# Patient Record
Sex: Male | Born: 1960 | Race: Black or African American | Hispanic: No | Marital: Married | Smoking: Never smoker
Health system: Southern US, Community
[De-identification: ages and names within clinical notes are randomized; demographics above are authoritative.]

## PROBLEM LIST (undated history)

## (undated) DIAGNOSIS — E785 Hyperlipidemia, unspecified: Secondary | ICD-10-CM

## (undated) DIAGNOSIS — I10 Essential (primary) hypertension: Secondary | ICD-10-CM

## (undated) HISTORY — PX: CATARACT EXTRACTION: SUR2

---

## 2000-09-24 ENCOUNTER — Ambulatory Visit (HOSPITAL_COMMUNITY): Admission: RE | Admit: 2000-09-24 | Discharge: 2000-09-24 | Payer: Self-pay | Admitting: Infectious Diseases

## 2000-09-24 ENCOUNTER — Encounter: Payer: Self-pay | Admitting: Infectious Diseases

## 2001-11-21 ENCOUNTER — Encounter: Payer: Self-pay | Admitting: General Practice

## 2001-11-21 ENCOUNTER — Encounter: Admission: RE | Admit: 2001-11-21 | Discharge: 2001-11-21 | Payer: Self-pay | Admitting: General Practice

## 2002-08-30 ENCOUNTER — Encounter: Payer: Self-pay | Admitting: Occupational Medicine

## 2002-08-30 ENCOUNTER — Encounter: Admission: RE | Admit: 2002-08-30 | Discharge: 2002-08-30 | Payer: Self-pay | Admitting: Occupational Medicine

## 2004-10-10 ENCOUNTER — Emergency Department (HOSPITAL_COMMUNITY): Admission: EM | Admit: 2004-10-10 | Discharge: 2004-10-10 | Payer: Self-pay | Admitting: Family Medicine

## 2005-03-10 ENCOUNTER — Emergency Department (HOSPITAL_COMMUNITY): Admission: EM | Admit: 2005-03-10 | Discharge: 2005-03-10 | Payer: Self-pay | Admitting: Family Medicine

## 2005-03-19 ENCOUNTER — Ambulatory Visit: Payer: Self-pay | Admitting: Gastroenterology

## 2005-03-27 ENCOUNTER — Encounter (INDEPENDENT_AMBULATORY_CARE_PROVIDER_SITE_OTHER): Payer: Self-pay | Admitting: Specialist

## 2005-03-27 ENCOUNTER — Ambulatory Visit: Payer: Self-pay | Admitting: Gastroenterology

## 2006-02-06 ENCOUNTER — Emergency Department (HOSPITAL_COMMUNITY): Admission: EM | Admit: 2006-02-06 | Discharge: 2006-02-06 | Payer: Self-pay | Admitting: Emergency Medicine

## 2006-06-09 ENCOUNTER — Emergency Department (HOSPITAL_COMMUNITY): Admission: EM | Admit: 2006-06-09 | Discharge: 2006-06-09 | Payer: Self-pay | Admitting: Family Medicine

## 2006-11-12 ENCOUNTER — Encounter: Admission: RE | Admit: 2006-11-12 | Discharge: 2006-11-23 | Payer: Self-pay | Admitting: Internal Medicine

## 2007-01-22 IMAGING — CR DG ABDOMEN 1V
2 series · 2 of 2 positions shown · non-contrast
Comparison: none

CLINICAL DATA: Fever. Chronic constipation. Gas.
 ABDOMEN ? 1 VIEW:

[view not recorded (1 of 2)]
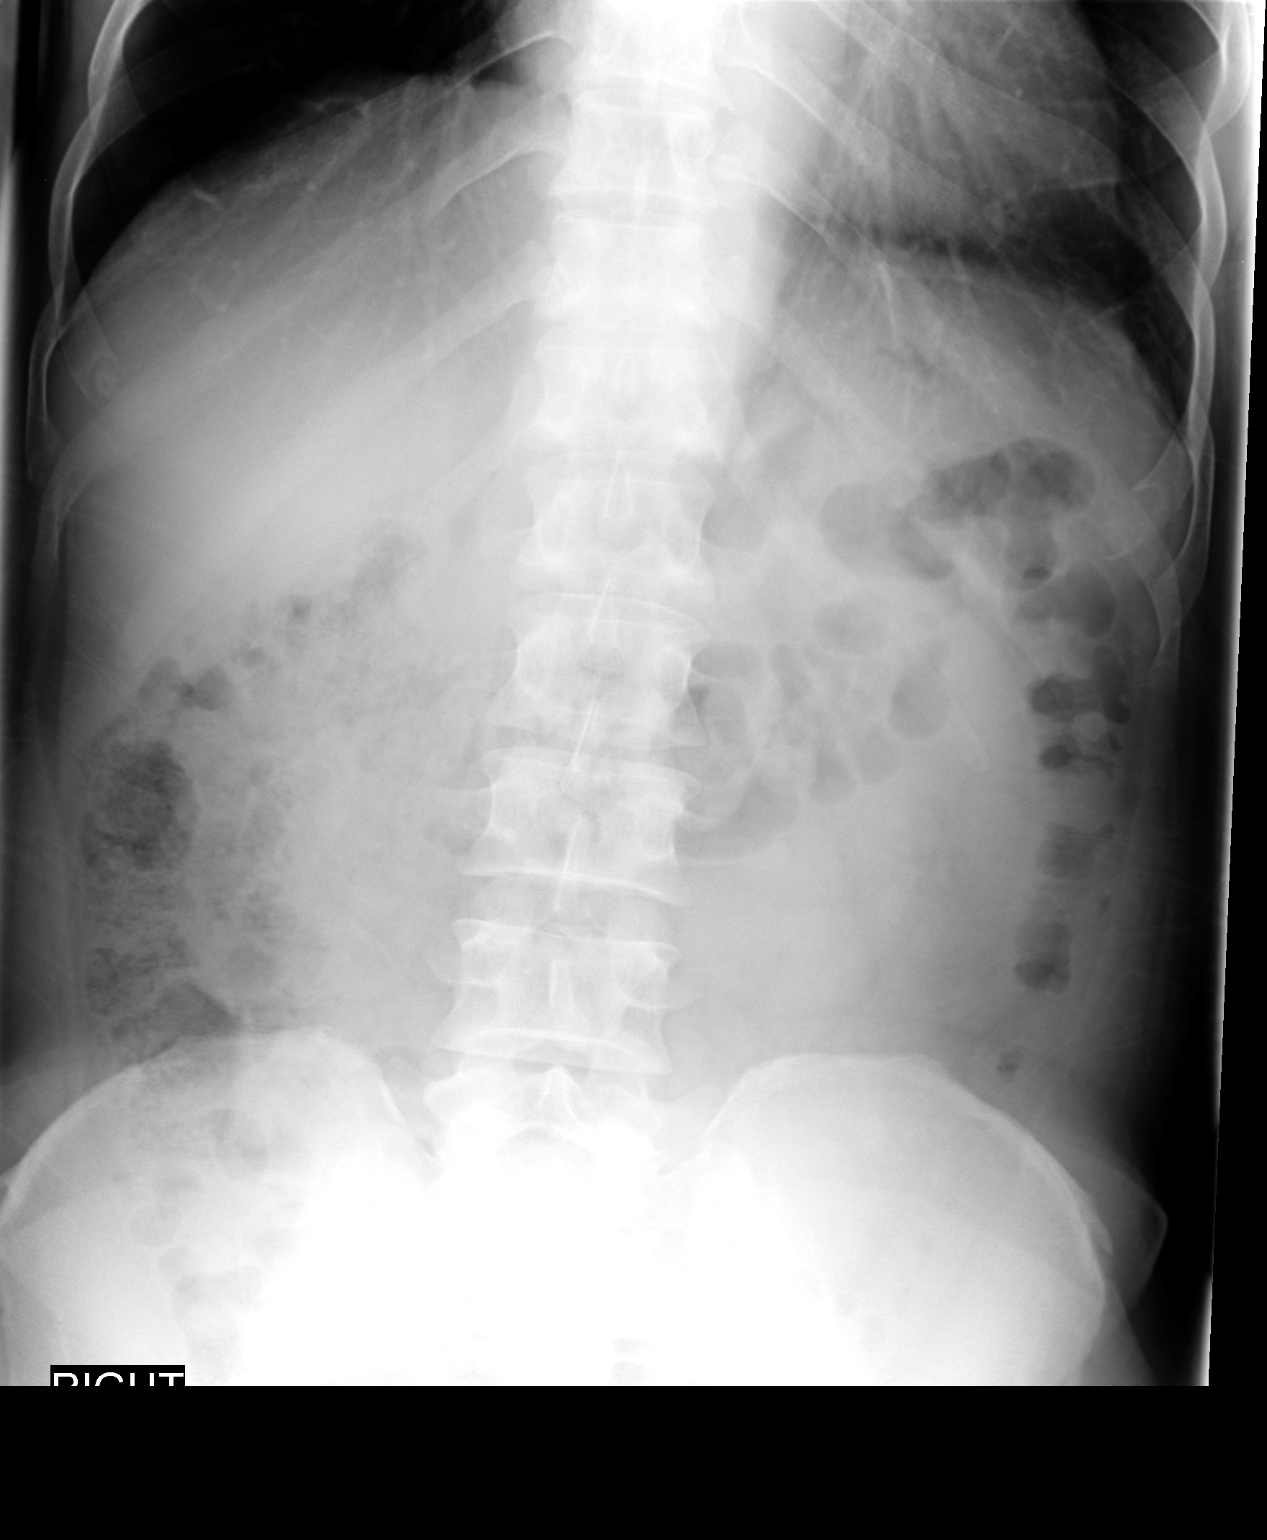

[view not recorded (2 of 2)]
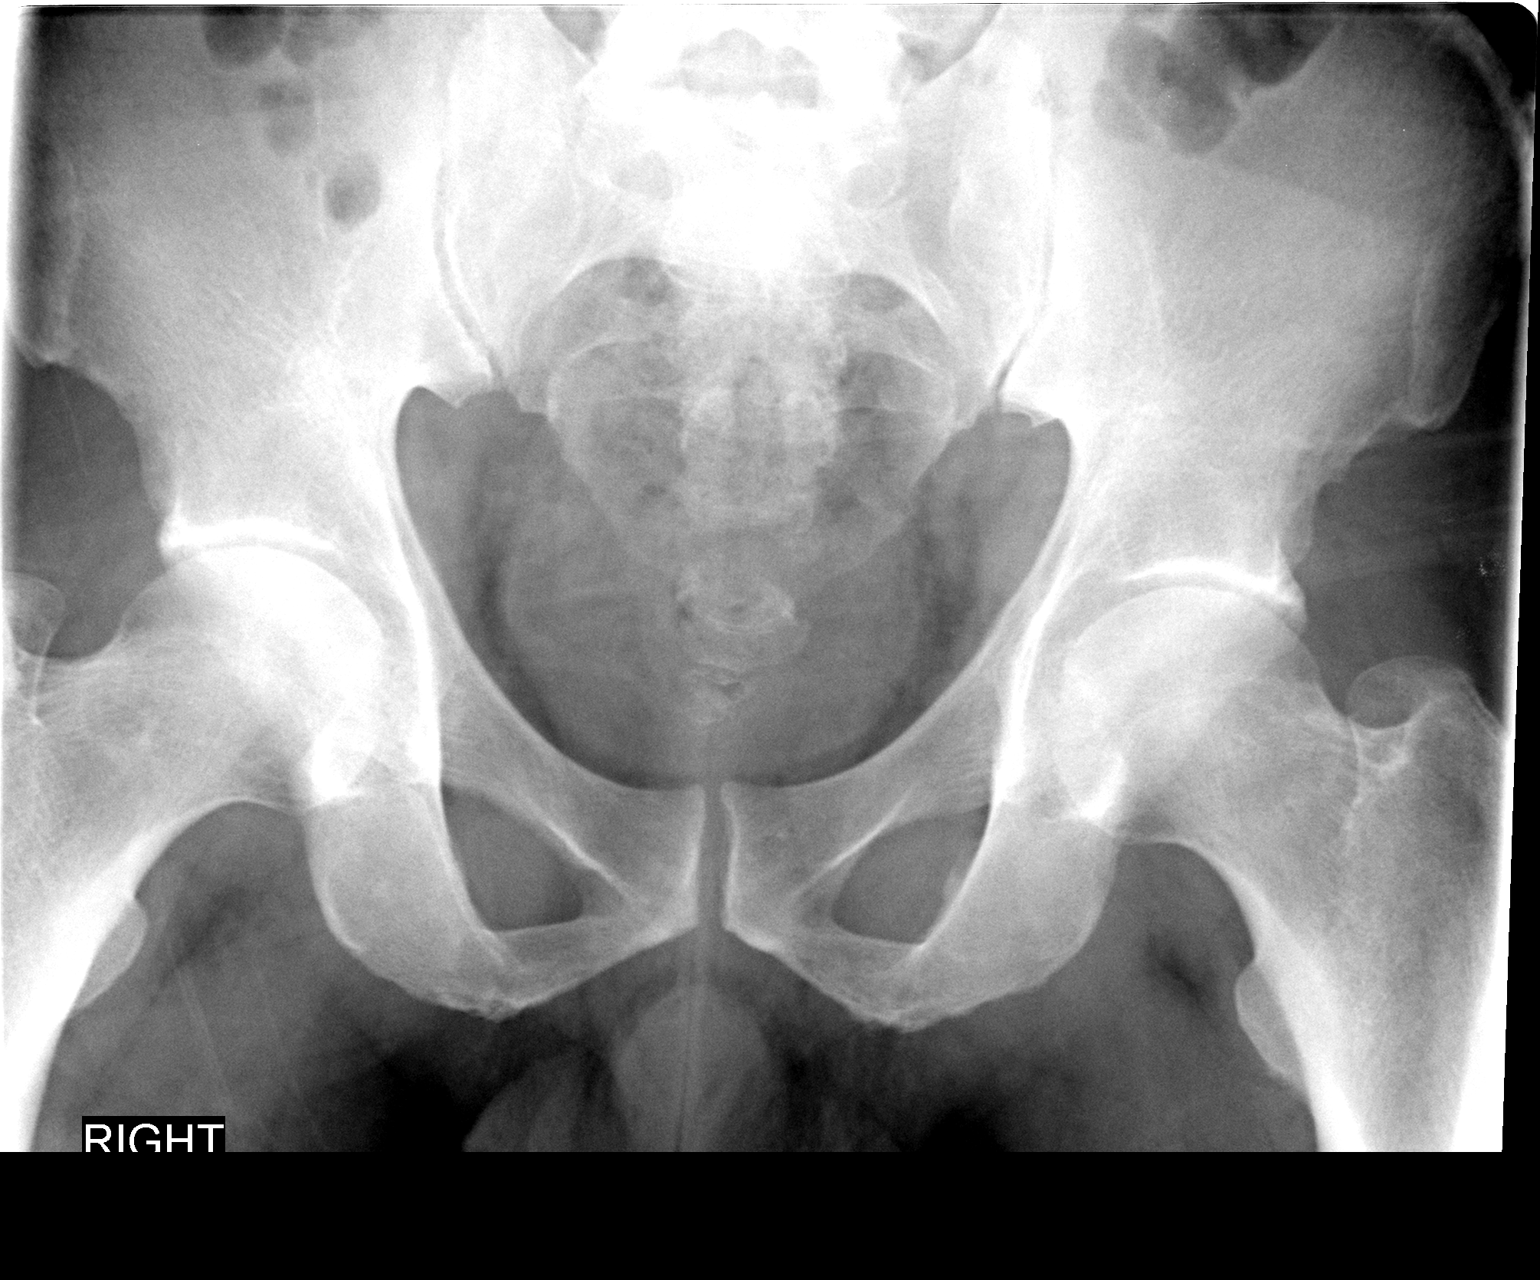

[2 of 2 positions shown; findings below may reference images not displayed]

FINDINGS: AP views of the abdomen are made without previous films for comparison and show a moderate amount of fecal material within the right transverse colon.  There may be a small fecal impaction in the region of the rectum, but no obstruction seen. No abdominal mass or calcification is present. The bones of the lower thoracic lumbar spine and pelvis appear normal.
IMPRESSION: Moderate amount of fecal material right and transverse colon and rectum without large fecal impaction or obstruction being seen and there may be a small fecal impaction or obstruction being seen and there may be a small fecal impaction in the rectum.

## 2011-10-27 ENCOUNTER — Encounter: Payer: Self-pay | Admitting: Gastroenterology

## 2015-06-06 ENCOUNTER — Encounter: Payer: Self-pay | Admitting: Gastroenterology

## 2020-07-30 ENCOUNTER — Encounter (HOSPITAL_COMMUNITY): Payer: Self-pay | Admitting: Emergency Medicine

## 2020-07-30 ENCOUNTER — Other Ambulatory Visit: Payer: Self-pay

## 2020-07-30 ENCOUNTER — Ambulatory Visit (HOSPITAL_COMMUNITY)
Admission: EM | Admit: 2020-07-30 | Discharge: 2020-07-30 | Disposition: A | Discharge status: Home / Self Care | Payer: Self-pay

## 2020-07-30 DIAGNOSIS — Z760 Encounter for issue of repeat prescription: Secondary | ICD-10-CM

## 2020-07-30 DIAGNOSIS — I10 Essential (primary) hypertension: Secondary | ICD-10-CM

## 2020-07-30 HISTORY — DX: Essential (primary) hypertension: I10

## 2020-07-30 HISTORY — DX: Hyperlipidemia, unspecified: E78.5

## 2020-07-30 MED ORDER — AMLODIPINE BESYLATE 10 MG PO TABS: 10.0000 mg | ORAL_TABLET | Freq: Every day | ORAL | 0 refills | Status: AC

## 2020-07-30 MED ORDER — HYDROCHLOROTHIAZIDE 12.5 MG PO TABS: 12.5000 mg | ORAL_TABLET | Freq: Every day | ORAL | 0 refills | Status: AC

## 2020-07-30 MED ORDER — ENALAPRIL MALEATE 10 MG PO TABS: 10.0000 mg | ORAL_TABLET | Freq: Every day | ORAL | 0 refills | Status: AC

## 2020-07-30 NOTE — ED Provider Notes (Signed)
[facility]    CSN: [id number] Arrival date & time: 07/30/20  1039      History   Chief Complaint Chief Complaint  Patient presents with   Medication Refill    HPI Larry Scott is a 60 y.o. male.   Patient here requesting refill of HTN medication, he has been out of medication X 1 week.  Requesting amlodpine 10 mg QD, enalapril 10 mg QD, and HCTZ 12.5 mg QD.  He has been getting medications from [place].  Denies HA, vision changes, blurry vision, double vision, CP, palptations, LE edema, n/v, n/t, weakness.     Past Medical History:  Diagnosis Date   Hyperlipidemia    Hypertension     There are no problems to display for this patient.   History reviewed. No pertinent surgical history.     Home Medications    Prior to Admission medications   Medication Sig Start Date End Date Taking? Authorizing Provider  amLODipine (NORVASC) 10 MG tablet Take 1 tablet (10 mg total) by mouth daily. 07/30/20   Evern Core, PA-C  enalapril (VASOTEC) 10 MG tablet Take 1 tablet (10 mg total) by mouth daily. 07/30/20   Evern Core, PA-C  hydrochlorothiazide (HYDRODIURIL) 12.5 MG tablet Take 1 tablet (12.5 mg total) by mouth daily. 07/30/20   Evern Core, PA-C    Family History History reviewed. No pertinent family history.  Social History Social History   Tobacco Use   Smoking status: Never   Smokeless tobacco: Never  Substance Use Topics   Alcohol use: Yes   Drug use: Never     Allergies   Patient has no known allergies.   Review of Systems Review of Systems  Constitutional:  Negative for activity change and fatigue.  Eyes:  Negative for visual disturbance.  Respiratory:  Negative for cough, chest tightness, shortness of breath and wheezing.   Cardiovascular:  Negative for chest pain, palpitations and leg swelling.  Gastrointestinal:  Negative for nausea and vomiting.  Musculoskeletal:  Negative for arthralgias and myalgias.  Skin:  Negative for  color change.  Neurological:  Negative for dizziness, syncope, speech difficulty, weakness, light-headedness, numbness and headaches.  Psychiatric/Behavioral:  Negative for confusion and sleep disturbance.     Physical Exam Triage Vital Signs ED Triage Vitals  Enc Vitals Group     BP 07/30/20 1118 (!) 147/84     Pulse Rate 07/30/20 1118 87     Resp 07/30/20 1118 16     Temp 07/30/20 1118 98.5 F (36.9 C)     Temp Source 07/30/20 1118 Oral     SpO2 07/30/20 1118 97 %     Weight --      Height --      Head Circumference --      Peak Flow --      Pain Score 07/30/20 1115 0     Pain Loc --      Pain Edu? --      Excl. in GC? --    No data found.  Updated Vital Signs BP (!) 147/84 (BP Location: Right Arm)   Pulse 87   Temp 98.5 F (36.9 C) (Oral)   Resp 16   SpO2 97%   Visual Acuity Right Eye Distance:   Left Eye Distance:   Bilateral Distance:    Right Eye Near:   Left Eye Near:    Bilateral Near:     Physical Exam Vitals and nursing note reviewed.  Constitutional:  Appearance: He is well-developed.  HENT:     Head: Normocephalic and atraumatic.  Eyes:     Extraocular Movements: Extraocular movements intact.     Right eye: Normal extraocular motion.     Left eye: Normal extraocular motion.     Conjunctiva/sclera: Conjunctivae normal.     Pupils: Pupils are equal, round, and reactive to light.  Cardiovascular:     Rate and Rhythm: Normal rate and regular rhythm.     Heart sounds: Normal heart sounds. No murmur heard. Pulmonary:     Effort: Pulmonary effort is normal. No respiratory distress.     Breath sounds: Normal breath sounds. No wheezing, rhonchi or rales.  Musculoskeletal:     Cervical back: Neck supple.     Right lower leg: No swelling or tenderness. No edema.     Left lower leg: No swelling or tenderness. No edema.  Skin:    General: Skin is warm and dry.     Capillary Refill: Capillary refill takes less than 2 seconds.  Neurological:      General: No focal deficit present.     Mental Status: He is alert and oriented to person, place, and time.     GCS: GCS eye subscore is 4. GCS verbal subscore is 5. GCS motor subscore is 6.     Cranial Nerves: Cranial nerves are intact. No cranial nerve deficit.     Motor: No tremor, abnormal muscle tone or pronator drift.     Deep Tendon Reflexes:     Reflex Scores:      Patellar reflexes are 0 on the right side and 0 on the left side.    UC Treatments / Results  Labs (all labs ordered are listed, but only abnormal results are displayed) Labs Reviewed - No data to display  EKG   Radiology No results found.  Procedures Procedures (including critical care time)  Medications Ordered in UC Medications - No data to display  Initial Impression / Assessment and Plan / UC Course  I have reviewed the triage vital signs and the nursing notes.  Pertinent labs & imaging results that were available during my care of the patient were reviewed by me and considered in my medical decision making (see chart for details).     Med refill Follow up with PCP for continued management ED precautions provided Final Clinical Impressions(s) / UC Diagnoses   Final diagnoses:  Medication refill  Primary hypertension     Discharge Instructions      Follow up with PCP     ED Prescriptions     Medication Sig Dispense Auth. Provider   amLODipine (NORVASC) 10 MG tablet Take 1 tablet (10 mg total) by mouth daily. 30 tablet Evern Core, PA-C   enalapril (VASOTEC) 10 MG tablet Take 1 tablet (10 mg total) by mouth daily. 30 tablet Evern Core, PA-C   hydrochlorothiazide (HYDRODIURIL) 12.5 MG tablet Take 1 tablet (12.5 mg total) by mouth daily. 30 tablet Evern Core, PA-C      PDMP not reviewed this encounter.   Evern Core, PA-C 07/30/20 1141

## 2020-07-30 NOTE — Discharge Instructions (Addendum)
Follow up with PCP

## 2020-07-30 NOTE — ED Triage Notes (Signed)
Pt presents for RX refill. States has been off medication for 5 days.

## 2021-07-22 ENCOUNTER — Other Ambulatory Visit: Payer: Self-pay

## 2021-07-22 ENCOUNTER — Encounter (HOSPITAL_COMMUNITY): Payer: Self-pay | Admitting: *Deleted

## 2021-07-22 ENCOUNTER — Ambulatory Visit (HOSPITAL_COMMUNITY)
Admission: EM | Admit: 2021-07-22 | Discharge: 2021-07-22 | Disposition: A | Discharge status: Home / Self Care | Payer: Self-pay | Attending: Student | Admitting: Student

## 2021-07-22 DIAGNOSIS — S46812A Strain of other muscles, fascia and tendons at shoulder and upper arm level, left arm, initial encounter: Secondary | ICD-10-CM

## 2021-07-22 MED ORDER — TIZANIDINE HCL 2 MG PO TABS: 2.0000 mg | ORAL_TABLET | Freq: Three times a day (TID) | ORAL | 0 refills | Status: DC | PRN

## 2021-07-22 NOTE — ED Provider Notes (Signed)
[facility]    CSN: [id number] Arrival date & time: 07/22/21  1122      History   Chief Complaint Chief Complaint  Patient presents with   Back Pain    HPI Larry Scott is a 61 y.o. male presenting with 3 months of left neck and back pain.  History hypertension, hyperlipidemia.  Describes left shoulder pain, worse with movement.  Occasionally radiation of the pain down the left arm, but not currently.  Denies trauma or overuse.  Denies midline spinous pain.  Denies headaches, vision changes  HPI  Past Medical History:  Diagnosis Date   Hyperlipidemia    Hypertension     There are no problems to display for this patient.   History reviewed. No pertinent surgical history.     Home Medications    Prior to Admission medications   Medication Sig Start Date End Date Taking? Authorizing Provider  tiZANidine (ZANAFLEX) 2 MG tablet Take 1 tablet (2 mg total) by mouth every 8 (eight) hours as needed for muscle spasms. 07/22/21  Yes Rhys Martini, PA-C  amLODipine (NORVASC) 10 MG tablet Take 1 tablet (10 mg total) by mouth daily. 07/30/20   Evern Core, PA-C  enalapril (VASOTEC) 10 MG tablet Take 1 tablet (10 mg total) by mouth daily. 07/30/20   Evern Core, PA-C  hydrochlorothiazide (HYDRODIURIL) 12.5 MG tablet Take 1 tablet (12.5 mg total) by mouth daily. 07/30/20   Evern Core, PA-C    Family History History reviewed. No pertinent family history.  Social History Social History   Tobacco Use   Smoking status: Never   Smokeless tobacco: Never  Substance Use Topics   Alcohol use: Yes   Drug use: Never     Allergies   Patient has no known allergies.   Review of Systems Review of Systems  Musculoskeletal:  Positive for neck pain.  All other systems reviewed and are negative.    Physical Exam Triage Vital Signs ED Triage Vitals  Enc Vitals Group     BP 07/22/21 1230 (!) 138/91     Pulse Rate 07/22/21 1230 71     Resp 07/22/21 1230  16     Temp 07/22/21 1230 98.3 F (36.8 C)     Temp src --      SpO2 07/22/21 1230 97 %     Weight --      Height --      Head Circumference --      Peak Flow --      Pain Score 07/22/21 1227 9     Pain Loc --      Pain Edu? --      Excl. in GC? --    No data found.  Updated Vital Signs BP (!) 138/91   Pulse 71   Temp 98.3 F (36.8 C)   Resp 16   SpO2 97%   Visual Acuity Right Eye Distance:   Left Eye Distance:   Bilateral Distance:    Right Eye Near:   Left Eye Near:    Bilateral Near:     Physical Exam Vitals reviewed.  Constitutional:      General: He is not in acute distress.    Appearance: Normal appearance. He is not ill-appearing.     Comments: Resting comfortably   HENT:     Head: Normocephalic and atraumatic.  Cardiovascular:     Rate and Rhythm: Normal rate and regular rhythm.     Heart sounds: Normal heart sounds.  Pulmonary:     Effort: Pulmonary effort is normal.     Breath sounds: Normal breath sounds and air entry.  Abdominal:     Tenderness: There is no abdominal tenderness. There is no right CVA tenderness, left CVA tenderness, guarding or rebound.  Musculoskeletal:     Cervical back: Normal range of motion. No swelling, deformity, signs of trauma, rigidity, spasms, tenderness, bony tenderness or crepitus. No pain with movement.     Thoracic back: No swelling, deformity, signs of trauma, spasms, tenderness or bony tenderness. Normal range of motion. No scoliosis.     Lumbar back: No swelling, deformity, signs of trauma, spasms, tenderness or bony tenderness. Normal range of motion. Negative right straight leg raise test and negative left straight leg raise test. No scoliosis.     Comments: No reproducible spinous or paraspinous tenderness. L trapezius pain elicited with abduction L arm. No shoulder jointline tenderness. No midline spinous tenderness, deformity, stepoff. Strength 5/5 upper extremities. Gait intact.   Absolutely no other injury,  deformity, tenderness, ecchymosis, abrasion.  Neurological:     General: No focal deficit present.     Mental Status: He is alert.     Cranial Nerves: No cranial nerve deficit.     Comments: CN 2-12 grossly intact, normal fingers to thumb.   Psychiatric:        Mood and Affect: Mood normal.        Behavior: Behavior normal.        Thought Content: Thought content normal.        Judgment: Judgment normal.      UC Treatments / Results  Labs (all labs ordered are listed, but only abnormal results are displayed) Labs Reviewed - No data to display  EKG   Radiology No results found.  Procedures Procedures (including critical care time)  Medications Ordered in UC Medications - No data to display  Initial Impression / Assessment and Plan / UC Course  I have reviewed the triage vital signs and the nursing notes.  Pertinent labs & imaging results that were available during my care of the patient were reviewed by me and considered in my medical decision making (see chart for details).     This patient is a very pleasant 61 y.o. year old male presenting with L trapezius strain x3 months. No red flag symptoms. Zanaflex sent. ED return precautions discussed. Patient verbalizes understanding and agreement.   Final Clinical Impressions(s) / UC Diagnoses   Final diagnoses:  Trapezius strain, left, initial encounter     Discharge Instructions      -Start the muscle relaxer-Zanaflex (tizanidine), up to 3 times daily for muscle spasms and pain.  This can make you drowsy, so take at bedtime or when you do not need to drive or operate machinery. -You can take Tylenol up to 1000 mg 3 times daily, and ibuprofen up to 600 mg 3 times daily with food.  You can take these together, or alternate every 3-4 hours. -You can continue over-the-counter products like icy hot, ointment, etc  -Heating pad  -Follow-up if new symptoms like weakness or sensation changes    ED Prescriptions      Medication Sig Dispense Auth. Provider   tiZANidine (ZANAFLEX) 2 MG tablet Take 1 tablet (2 mg total) by mouth every 8 (eight) hours as needed for muscle spasms. 21 tablet Rhys Martini, PA-C      PDMP not reviewed this encounter.   Rhys Martini, PA-C 07/22/21 1303

## 2021-07-22 NOTE — ED Triage Notes (Signed)
Pt reports 3 months of of pain at base of neck that goes down to lower back and lt shoulder.

## 2021-07-22 NOTE — Discharge Instructions (Addendum)
-  Start the muscle relaxer-Zanaflex (tizanidine), up to 3 times daily for muscle spasms and pain.  This can make you drowsy, so take at bedtime or when you do not need to drive or operate machinery. -You can take Tylenol up to 1000 mg 3 times daily, and ibuprofen up to 600 mg 3 times daily with food.  You can take these together, or alternate every 3-4 hours. -You can continue over-the-counter products like icy hot, ointment, etc  -Heating pad  -Follow-up if new symptoms like weakness or sensation changes

## 2022-01-15 ENCOUNTER — Ambulatory Visit (HOSPITAL_COMMUNITY)
Admission: EM | Admit: 2022-01-15 | Discharge: 2022-01-15 | Disposition: A | Discharge status: Home / Self Care | Payer: Commercial Managed Care - HMO | Attending: Internal Medicine | Admitting: Internal Medicine

## 2022-01-15 ENCOUNTER — Encounter (HOSPITAL_COMMUNITY): Payer: Self-pay

## 2022-01-15 ENCOUNTER — Ambulatory Visit (INDEPENDENT_AMBULATORY_CARE_PROVIDER_SITE_OTHER): Payer: Commercial Managed Care - HMO

## 2022-01-15 DIAGNOSIS — M545 Low back pain, unspecified: Secondary | ICD-10-CM

## 2022-01-15 DIAGNOSIS — M5136 Other intervertebral disc degeneration, lumbar region: Secondary | ICD-10-CM

## 2022-01-15 NOTE — Discharge Instructions (Addendum)
Please continue taking your anti-inflammatory agents as needed Gentle stretching exercises Back strengthening exercises recommended Please follow-up with orthopedic surgery if your pain is severe or if the frequency increases If you have any other concerns please return to urgent care to be reevaluated.

## 2022-01-15 NOTE — ED Triage Notes (Signed)
Pt states lower back pain on and off for months.

## 2022-01-16 NOTE — ED Provider Notes (Signed)
[facility]    CSN: [id number] Arrival date & time: 01/15/22  1004      History   Chief Complaint Chief Complaint  Patient presents with   Back Pain    HPI Larry Scott is a 61 y.o. male with history of hypertension and hyperlipidemia comes to urgent care with complaints of lower back pain over the past several months.  Pain in the back is intermittent in nature, sharp with throbbing aggravated by movement sometimes and relieved by NSAID use.  He denies any urinary or bowel problems.  Pain does not radiate into the lower extremities.  No weakness in lower extremities.  Patient denies any falls or trauma to the back.  No heavy lifting. HPI  Past Medical History:  Diagnosis Date   Hyperlipidemia    Hypertension     There are no problems to display for this patient.   History reviewed. No pertinent surgical history.     Home Medications    Prior to Admission medications   Medication Sig Start Date End Date Taking? Authorizing Provider  amLODipine (NORVASC) 10 MG tablet Take 1 tablet (10 mg total) by mouth daily. 07/30/20   Evern Core, PA-C  enalapril (VASOTEC) 10 MG tablet Take 1 tablet (10 mg total) by mouth daily. 07/30/20   Evern Core, PA-C  hydrochlorothiazide (HYDRODIURIL) 12.5 MG tablet Take 1 tablet (12.5 mg total) by mouth daily. 07/30/20   Evern Core, PA-C  tiZANidine (ZANAFLEX) 2 MG tablet Take 1 tablet (2 mg total) by mouth every 8 (eight) hours as needed for muscle spasms. 07/22/21   Rhys Martini, PA-C    Family History History reviewed. No pertinent family history.  Social History Social History   Tobacco Use   Smoking status: Never   Smokeless tobacco: Never  Substance Use Topics   Alcohol use: Yes   Drug use: Never     Allergies   Patient has no known allergies.   Review of Systems Review of Systems As per HPI  Physical Exam Triage Vital Signs ED Triage Vitals  Enc Vitals Group     BP 01/15/22 1308 138/68      Pulse Rate 01/15/22 1308 67     Resp 01/15/22 1308 16     Temp 01/15/22 1308 98 F (36.7 C)     Temp Source 01/15/22 1308 Oral     SpO2 01/15/22 1308 99 %     Weight 01/15/22 1310 190 lb (86.2 kg)     Height 01/15/22 1310 5\' 8"  (1.727 m)     Head Circumference --      Peak Flow --      Pain Score 01/15/22 1310 5     Pain Loc --      Pain Edu? --      Excl. in GC? --    No data found.  Updated Vital Signs BP 138/68 (BP Location: Right Arm)   Pulse 67   Temp 98 F (36.7 C) (Oral)   Resp 16   Ht 5\' 8"  (1.727 m)   Wt 86.2 kg   SpO2 99%   BMI 28.89 kg/m   Visual Acuity Right Eye Distance:   Left Eye Distance:   Bilateral Distance:    Right Eye Near:   Left Eye Near:    Bilateral Near:     Physical Exam Vitals and nursing note reviewed.  Constitutional:      Appearance: Normal appearance.  Cardiovascular:     Rate and Rhythm:  Normal rate and regular rhythm.     Pulses: Normal pulses.     Heart sounds: Normal heart sounds.  Pulmonary:     Effort: Pulmonary effort is normal.     Breath sounds: Normal breath sounds.  Musculoskeletal:        General: No swelling, tenderness, deformity or signs of injury. Normal range of motion.     Right lower leg: No edema.     Left lower leg: No edema.  Skin:    General: Skin is warm.     Findings: No bruising or erythema.  Neurological:     Mental Status: He is alert.      UC Treatments / Results  Labs (all labs ordered are listed, but only abnormal results are displayed) Labs Reviewed - No data to display  EKG   Radiology DG Lumbar Spine Complete  Result Date: 01/15/2022 CLINICAL DATA:  Recurrent back pain. EXAM: LUMBAR SPINE - COMPLETE 4+ VIEW COMPARISON:  None Available. FINDINGS: There is a very mild curvature of the lower thoracic and lumbar spine which is convex towards the left. Alignment on the lateral projection radiograph appears normal. The vertebral body heights are well preserved. Multi level ventral  endplate spurring noted throughout the lumbar spine. No acute fracture or subluxation. IMPRESSION: 1. No acute findings. 2. Mild scoliosis and degenerative disc disease. Electronically Signed   By: Signa Kell M.D.   On: 01/15/2022 14:35    Procedures Procedures (including critical care time)  Medications Ordered in UC Medications - No data to display  Initial Impression / Assessment and Plan / UC Course  I have reviewed the triage vital signs and the nursing notes.  Pertinent labs & imaging results that were available during my care of the patient were reviewed by me and considered in my medical decision making (see chart for details).     1.  Back pain secondary to degenerative disc disease: X-ray of the lumbosacral spine is remarkable for mild scoliosis and degenerative disc disease Patient is advised to continue NSAID use Gentle stretching exercises Back strengthening exercises Follow-up with orthopedic surgery for further management if pain is persistent or worsens. Final Clinical Impressions(s) / UC Diagnoses   Final diagnoses:  Degenerative disc disease, lumbar     Discharge Instructions      Please continue taking your anti-inflammatory agents as needed Gentle stretching exercises Back strengthening exercises recommended Please follow-up with orthopedic surgery if your pain is severe or if the frequency increases If you have any other concerns please return to urgent care to be reevaluated.   ED Prescriptions   None    PDMP not reviewed this encounter.   Merrilee Jansky, MD 01/16/22 [id number]

## 2022-01-20 ENCOUNTER — Encounter: Payer: Self-pay | Admitting: Family Medicine

## 2022-01-20 ENCOUNTER — Ambulatory Visit (INDEPENDENT_AMBULATORY_CARE_PROVIDER_SITE_OTHER): Payer: Commercial Managed Care - HMO | Admitting: Family Medicine

## 2022-01-20 VITALS — BP 143/86 | HR 76 | Ht 68.0 in | Wt 193.6 lb

## 2022-01-20 DIAGNOSIS — Z114 Encounter for screening for human immunodeficiency virus [HIV]: Secondary | ICD-10-CM

## 2022-01-20 DIAGNOSIS — K219 Gastro-esophageal reflux disease without esophagitis: Secondary | ICD-10-CM | POA: Insufficient documentation

## 2022-01-20 DIAGNOSIS — M545 Low back pain, unspecified: Secondary | ICD-10-CM | POA: Diagnosis not present

## 2022-01-20 DIAGNOSIS — Z13228 Encounter for screening for other metabolic disorders: Secondary | ICD-10-CM

## 2022-01-20 DIAGNOSIS — I10 Essential (primary) hypertension: Secondary | ICD-10-CM

## 2022-01-20 DIAGNOSIS — Z1211 Encounter for screening for malignant neoplasm of colon: Secondary | ICD-10-CM

## 2022-01-20 DIAGNOSIS — G8929 Other chronic pain: Secondary | ICD-10-CM

## 2022-01-20 DIAGNOSIS — Z1159 Encounter for screening for other viral diseases: Secondary | ICD-10-CM

## 2022-01-20 MED ORDER — FAMOTIDINE 20 MG PO TABS: 20.0000 mg | ORAL_TABLET | Freq: Two times a day (BID) | ORAL | 1 refills | Status: DC

## 2022-01-20 NOTE — Progress Notes (Signed)
Subjective:  Patient ID: Larry Scott, male    DOB: 12-Jun-1960, 61 y.o.   MRN: [id number]  CC: New Patient  HPI:  Larry Scott is a very pleasant 61 y.o. male who presents today to establish care. Previously followed with physician in [place].  Hx HLD, HTN, on amlodipine, HCTZ, vasotec. Doesn't usually check home BP.  Agrees he may be a little bit nervous today.  Endorses chronic occasional migraines and vertigo.  Recently visited urgent care for back pain, found to have mild degenerative disc disease. Well controlled w/ NSAIDs. Denies shooting pain down legs, saddle anesthesia, bowel/bladder incontinence  Got eye surgery yesterday with [facility]. Another procedure planned for 12/18. Using eye drops for this.  Feels that he gets constipated and gas pains easily. Has tried laxatives in the past without help. Endorses some burning/reflux pains after eating.  Open to getting colonoscopy and screening labs   PMHx: Past Medical History:  Diagnosis Date   Hyperlipidemia    Hypertension     Surgical Hx: Eye surgery, unspecified   Family Hx: Family History  Problem Relation Age of Onset   Hypertension Mother     Social Hx:  Current Social History   Who lives at home: Wife  Feels safe in his relationship Work / Education:  Unemployed Current stressors: Feels like his back/gas pain keeps him from "being free" Exercises fairly regularly by walking but has been limited a bit by pain Never smoked.  Occasionally drinks beer    Medications: amlodipine, HCTZ, vasotec   ROS:  Per HPI plus: Endorses occasional constipation. Denies recent vomiting. Denies recent CP/SOB, blood in stool or urine    ROS: pertinent noted in the HPI    Objective:  BP (!) 143/86   Pulse 76   Ht 5\' 8"  (1.727 m)   Wt 193 lb 9.6 oz (87.8 kg)   SpO2 100%   BMI 29.44 kg/m  Vitals and nursing note reviewed  General: NAD, pleasant, able to participate in exam Cardiac: RRR, no murmurs  auscultated Respiratory: CTAB, normal WOB Abdomen: soft, non-tender, non-distended, normoactive bowel sounds Extremities: warm and well perfused, no edema or cyanosis. Moves all extremities equally Skin: warm and dry, no rashes noted Neuro: alert, no obvious focal deficits, speech normal. Normal gait Psych: Normal affect and mood  Assessment & Plan:  Hypertension, unspecified type Assessment & Plan: 143/86 today.  Patient feels that he is overall well-controlled but may be a little nervous today so [facility] is a strong possibility. Taking amlodipine, HCTZ, Vasotec.  Will check BMP given that he is on enalapril, will also obtain A1c and lipid panel given that we have no previous labs on file.  Follow-up in 2 months  Orders: -     Basic metabolic panel  Gastroesophageal reflux disease, unspecified whether esophagitis present Assessment & Plan: Possible GERD given the description of his pain.  Constipation may also be contributing.  Does endorse some occasional burning sensation after meals.  Reports having normal colonoscopy and EGD in the past.  Willing to get screening colonoscopy.  Will refer for this, and prescribe Pepcid to see if this alleviates his pain.  Patient wishes to try this rather than a laxative.  Follow-up in 2 months  Orders: -     Famotidine; Take 1 tablet (20 mg total) by mouth 2 (two) times daily.  Dispense: 60 tablet; Refill: 1  Chronic low back pain without sciatica, unspecified back pain laterality Assessment & Plan: Low back pain  with x-ray finding of mild degenerative disc disease at recent urgent care visit.  Denies radicular pain or red flag symptoms.  Exam is benign today.  Will encourage continued conservative management with NSAID/Tylenol use and suggest use of lidocaine patches.  May consider referral for PT if no improvement   Screening for HIV (human immunodeficiency virus) -     HIV Antibody (routine testing w rflx)  Encounter for hepatitis C screening test  for low risk patient -     HCV Ab w Reflex to Quant PCR  Screening for metabolic disorder -     Hemoglobin A1c -     Lipid panel  Encounter for screening colonoscopy -     Ambulatory referral to Gastroenterology   Meds ordered this encounter  Medications   famotidine (PEPCID) 20 MG tablet    Sig: Take 1 tablet (20 mg total) by mouth 2 (two) times daily.    Dispense:  60 tablet    Refill:  1   Return in about 2 months (around 03/23/2022) for HTN, reflux pain. Vonna Drafts, MD 01/20/2022, 2:24 PM PGY-1, [facility]

## 2022-01-20 NOTE — Patient Instructions (Addendum)
It was wonderful to see you today.  Please bring ALL of your medications with you to every visit.   Updates from today's visit:  I will give you a call if any of your results from today are abnormal.   Please try taking Pepcid to see if it helps with your belly pains  Please review the attached documents for more information about fiber rich foods  Please try to use a lidocaine patch for your back pain  Please give our office a call if you start to have any severe pain, vomiting, fevers, difficulty breathing  Please follow up in 2 months to talk more about your belly pain and blood pressure  Thank you for choosing [facility].   Please call [phone] with any questions about today's appointment.  Please be sure to schedule follow up at the front  desk before you leave today.   Vonna Drafts, MD  Family Medicine

## 2022-01-20 NOTE — Assessment & Plan Note (Addendum)
143/86 today.  Patient feels that he is overall well-controlled but may be a little nervous today so [facility] is a strong possibility. Taking amlodipine, HCTZ, Vasotec.  Will check BMP given that he is on enalapril, will also obtain A1c and lipid panel given that we have no previous labs on file.  Follow-up in 2 months

## 2022-01-20 NOTE — Assessment & Plan Note (Addendum)
Low back pain with x-ray finding of mild degenerative disc disease at recent urgent care visit.  Denies radicular pain or red flag symptoms.  Exam is benign today.  Will encourage continued conservative management with NSAID/Tylenol use and suggest use of lidocaine patches.  May consider referral for PT if no improvement

## 2022-01-20 NOTE — Assessment & Plan Note (Addendum)
Possible GERD given the description of his pain.  Constipation may also be contributing.  Does endorse some occasional burning sensation after meals.  Reports having normal colonoscopy and EGD in the past.  Willing to get screening colonoscopy.  Will refer for this, and prescribe Pepcid to see if this alleviates his pain.  Patient wishes to try this rather than a laxative.  Follow-up in 2 months

## 2022-01-21 LAB — LIPID PANEL
Chol/HDL Ratio: 2.9 ratio (ref 0.0–5.0)
Cholesterol, Total: 230 mg/dL — ABNORMAL HIGH (ref 100–199)
HDL: 80 mg/dL (ref >39)
LDL Chol Calc (NIH): 120 mg/dL — ABNORMAL HIGH (ref 0–99)
Triglycerides: 175 mg/dL — ABNORMAL HIGH (ref 0–149)
VLDL Cholesterol Cal: 30 mg/dL (ref 5–40)

## 2022-01-21 LAB — BASIC METABOLIC PANEL
BUN/Creatinine Ratio: 9 — ABNORMAL LOW (ref 10–24)
BUN: 10 mg/dL (ref 8–27)
CO2: 27 mmol/L (ref 20–29)
Calcium: 9.8 mg/dL (ref 8.6–10.2)
Chloride: 101 mmol/L (ref 96–106)
Creatinine, Ser: 1.08 mg/dL (ref 0.76–1.27)
Glucose: 130 mg/dL — ABNORMAL HIGH (ref 70–99)
Potassium: 4.3 mmol/L (ref 3.5–5.2)
Sodium: 143 mmol/L (ref 134–144)
eGFR: 77 mL/min/{1.73_m2} (ref >59)

## 2022-01-21 LAB — HCV AB W REFLEX TO QUANT PCR: HCV Ab: NONREACTIVE

## 2022-01-21 LAB — HIV ANTIBODY (ROUTINE TESTING W REFLEX): HIV Screen 4th Generation wRfx: NONREACTIVE

## 2022-01-21 LAB — HEMOGLOBIN A1C
Est. average glucose Bld gHb Est-mCnc: 137 mg/dL
Hgb A1c MFr Bld: 6.4 % — ABNORMAL HIGH (ref 4.8–5.6)

## 2022-01-21 LAB — HCV INTERPRETATION

## 2022-01-22 ENCOUNTER — Telehealth: Payer: Self-pay | Admitting: Family Medicine

## 2022-01-22 DIAGNOSIS — E785 Hyperlipidemia, unspecified: Secondary | ICD-10-CM

## 2022-01-22 MED ORDER — ROSUVASTATIN CALCIUM 20 MG PO TABS: 20.0000 mg | ORAL_TABLET | Freq: Every day | ORAL | 3 refills | Status: AC

## 2022-01-22 NOTE — Telephone Encounter (Signed)
Called to discuss lab results. Discussed lifestyle changes for prediabetes (A1c 6.4). Prescribed Crestor 20mg  qd for HLD. F/u at next visit  The 10-year ASCVD risk score (Arnett DK, et al., 2019) is: 16.6%   Values used to calculate the score:     Age: 61 years     Sex: Male     Is Non-Hispanic African American: Yes     Diabetic: No     Tobacco smoker: No     Systolic Blood Pressure: 143 mmHg     Is BP treated: Yes     HDL Cholesterol: 80 mg/dL     Total Cholesterol: 230 mg/dL  62, MD

## 2022-02-12 ENCOUNTER — Encounter (HOSPITAL_COMMUNITY): Payer: Self-pay | Admitting: *Deleted

## 2022-02-12 ENCOUNTER — Ambulatory Visit (HOSPITAL_COMMUNITY)
Admission: EM | Admit: 2022-02-12 | Discharge: 2022-02-12 | Disposition: A | Discharge status: Home / Self Care | Payer: Commercial Managed Care - HMO | Attending: Family Medicine | Admitting: Family Medicine

## 2022-02-12 DIAGNOSIS — M545 Low back pain, unspecified: Secondary | ICD-10-CM | POA: Diagnosis not present

## 2022-02-12 DIAGNOSIS — J069 Acute upper respiratory infection, unspecified: Secondary | ICD-10-CM | POA: Diagnosis not present

## 2022-02-12 MED ORDER — BENZONATATE 100 MG PO CAPS: ORAL_CAPSULE | ORAL | 0 refills | Status: AC

## 2022-02-12 MED ORDER — MELOXICAM 15 MG PO TABS: 15.0000 mg | ORAL_TABLET | Freq: Every day | ORAL | 0 refills | Status: DC

## 2022-02-12 NOTE — ED Triage Notes (Signed)
Pt complains of cough, congestion, watery eyes x 3 days. His wife complains of heavy snoring. He complains of left ear pain. He is taking OTC meds.

## 2022-02-12 NOTE — ED Provider Notes (Signed)
  [facility]   [id number] 02/12/22 Arrival Time: 0865  ASSESSMENT & PLAN:  1. Viral URI with cough   2. Acute bilateral low back pain without sciatica    MSK back pain. May end up needing PT; discussed. Discussed typical duration of current likely viral illness. No respiratory distress. OTC symptom care as needed.  Discharge Medication List as of 02/12/2022 10:40 AM     START taking these medications   Details  benzonatate (TESSALON) 100 MG capsule Take 1 capsule by mouth every 8 (eight) hours for cough., Normal       Recommend:  Follow-up Information     August Albino, MD.   Specialty: Family Medicine Why: If worsening or failing to improve as anticipated. Contact information: [place] [phone]                 Reviewed expectations re: course of current medical issues. Questions answered. Outlined signs and symptoms indicating need for more acute intervention. Understanding verbalized. After Visit Summary given.   SUBJECTIVE: History from: Patient. Larry Scott is a 62 y.o. male. Reports: cough, chest congestion, nasal congestion, watery eyes; abrupt onset; x 3 days. No assoc CP/SOB. Denies: fever and difficulty breathing. Bilateral ear discomfort without drainage or bleeding. Normal PO intake without n/v/d. Also reports on/off LBP over past few months; no injury/trauma. Usu lasts a few days then resolves. No extremity sensation changes or weakness. Normal bowel/bladder habits. No tx PTA.  OBJECTIVE:  Vitals:   02/12/22 1004  BP: (!) 149/91  Pulse: 77  Resp: 18  Temp: 98.7 F (37.1 C)  TempSrc: Oral  SpO2: 97%    General appearance: alert; no distress Eyes: PERRLA; EOMI; conjunctiva normal HENT: [place]; AT; with nasal congestion; bilat mild serous otitis Neck: supple  Lungs: speaks full sentences without difficulty; unlabored; CTAB Back: vague bilateral lumbar paraspinal TTP; no midline TTP; FROM at  waist Extremities: no edema Skin: warm and dry Neurologic: normal gait; normal sensation and strength of bilat LE Psychological: alert and cooperative; normal mood and affect    No Known Allergies  Past Medical History:  Diagnosis Date   Hyperlipidemia    Hypertension    Social History   Socioeconomic History   Marital status: Married    Spouse name: Not on file   Number of children: Not on file   Years of education: Not on file   Highest education level: Not on file  Occupational History   Not on file  Tobacco Use   Smoking status: Never   Smokeless tobacco: Never  Vaping Use   Vaping Use: Never used  Substance and Sexual Activity   Alcohol use: Yes   Drug use: Never   Sexual activity: Yes  Other Topics Concern   Not on file  Social History Narrative   Not on file   Social Determinants of Health   Financial Resource Strain: Not on file  Food Insecurity: Not on file  Transportation Needs: Not on file  Physical Activity: Not on file  Stress: Not on file  Social Connections: Not on file  Intimate Partner Violence: Not on file   Family History  Problem Relation Age of Onset   Hypertension Mother    Past Surgical History:  Procedure Laterality Date   CATARACT EXTRACTION Bilateral      Vanessa Kick, MD 02/12/22 1047

## 2022-03-03 ENCOUNTER — Encounter: Payer: Self-pay | Admitting: Family Medicine

## 2022-10-26 ENCOUNTER — Telehealth: Payer: Self-pay | Admitting: Pharmacist

## 2022-10-26 NOTE — Telephone Encounter (Signed)
Attempted to contact patient for follow-up of blood pressure/hypertension control  Unable to leave HIPAA compliant voice mail requesting call back to direct phone due to patient number invalid: [phone] for scheduling blood pressure visit with clinic pharmacist.   Total time with patient call and documentation of interaction: 5 minutes.

## 2023-06-23 ENCOUNTER — Ambulatory Visit (HOSPITAL_COMMUNITY)
Admission: EM | Admit: 2023-06-23 | Discharge: 2023-06-23 | Disposition: A | Discharge status: Home / Self Care | Attending: Sports Medicine | Admitting: Sports Medicine

## 2023-06-23 ENCOUNTER — Ambulatory Visit (INDEPENDENT_AMBULATORY_CARE_PROVIDER_SITE_OTHER)

## 2023-06-23 ENCOUNTER — Ambulatory Visit (HOSPITAL_COMMUNITY)

## 2023-06-23 ENCOUNTER — Encounter (HOSPITAL_COMMUNITY): Payer: Self-pay | Admitting: *Deleted

## 2023-06-23 DIAGNOSIS — M25562 Pain in left knee: Secondary | ICD-10-CM

## 2023-06-23 MED ORDER — MELOXICAM 15 MG PO TABS: 15.0000 mg | ORAL_TABLET | Freq: Every day | ORAL | 0 refills | Status: AC

## 2023-06-23 NOTE — ED Provider Notes (Signed)
 [facility]    CSN: [id number] Arrival date & time: 06/23/23  4098      History   Chief Complaint Chief Complaint  Patient presents with   Knee Pain    HPI Larry Scott is a 63 y.o. male here with 3 weeks of left knee pain. He states he was out for a walk and felt a pop in the medial aspect of his left knee. He has had pain and swelling in the knee since. Pain is constant, but worse with activity. He tries to walk 2x/d but has been limited by the pain. He has been using a compression sleeve, heating pad, and ibuprofen with only mild relief. Denies catching or locking or instability. No previous knee injuries or surgeries.    Knee Pain   Past Medical History:  Diagnosis Date   Hyperlipidemia    Hypertension     Patient Active Problem List   Diagnosis Date Noted   Hypertension 01/20/2022   Gastroesophageal reflux disease 01/20/2022   Low back pain 01/20/2022    Past Surgical History:  Procedure Laterality Date   CATARACT EXTRACTION Bilateral        Home Medications    Prior to Admission medications   Medication Sig Start Date End Date Taking? Authorizing Provider  amLODipine  (NORVASC ) 10 MG tablet Take 1 tablet (10 mg total) by mouth daily. 07/30/20  Yes Lavonia Powers, PA-C  aspirin 81 MG chewable tablet Chew 81 mg by mouth daily.   Yes [provider]  enalapril  (VASOTEC ) 10 MG tablet Take 1 tablet (10 mg total) by mouth daily. 07/30/20  Yes Lavonia Powers, PA-C  hydrochlorothiazide  (HYDRODIURIL ) 12.5 MG tablet Take 1 tablet (12.5 mg total) by mouth daily. 07/30/20  Yes Lavonia Powers, PA-C  ofloxacin (OCUFLOX) 0.3 % ophthalmic solution Place into the left eye. 01/26/22  Yes [provider]  rosuvastatin  (CRESTOR ) 20 MG tablet Take 1 tablet (20 mg total) by mouth daily. 01/22/22  Yes Edison Gore, MD  benzonatate  (TESSALON ) 100 MG capsule Take 1 capsule by mouth every 8 (eight) hours for cough. 02/12/22   Afton Albright, MD   meloxicam  (MOBIC ) 15 MG tablet Take 1 tablet (15 mg total) by mouth daily. 06/23/23   Doshie Maggi D, MD  prednisoLONE acetate (PRED FORTE) 1 % ophthalmic suspension 1 drop 2 (two) times daily. 02/09/22   [provider]    Family History Family History  Problem Relation Age of Onset   Hypertension Mother     Social History Social History   Tobacco Use   Smoking status: Never   Smokeless tobacco: Never  Vaping Use   Vaping status: Never Used  Substance Use Topics   Alcohol use: Yes   Drug use: Never     Allergies   Patient has no known allergies.   Review of Systems Review of Systems   Physical Exam Triage Vital Signs ED Triage Vitals  Encounter Vitals Group     BP 06/23/23 1004 (!) 148/83     Systolic BP Percentile --      Diastolic BP Percentile --      Pulse Rate 06/23/23 1004 85     Resp 06/23/23 1004 18     Temp 06/23/23 1004 98.3 F (36.8 C)     Temp Source 06/23/23 1004 Oral     SpO2 06/23/23 1004 95 %     Weight --      Height --      Head Circumference --  Peak Flow --      Pain Score 06/23/23 1002 7     Pain Loc --      Pain Education --      Exclude from Growth Chart --    No data found.  Updated Vital Signs BP (!) 148/83 (BP Location: Left Arm)   Pulse 85   Temp 98.3 F (36.8 C) (Oral)   Resp 18   SpO2 95%   Physical Exam Constitutional:      Appearance: Normal appearance.  HENT:     Head: Normocephalic and atraumatic.  Cardiovascular:     Rate and Rhythm: Normal rate.     Pulses: Normal pulses.  Pulmonary:     Effort: Pulmonary effort is normal.  Musculoskeletal:     Comments: LEFT KNEE MSK: Moderate effusion present. No gross deformity, ecchymoses. TTP along bilateral joint lines, medial>lateral. Non-tender quad tendon or patellar tendon. FROM with normal strength. Negative ant/post drawers. Negative valgus/varus testing. Negative lachman. Positive mcmurrays, negative Thessaly. NV intact distally.    Neurological:     Mental Status: He is alert.      UC Treatments / Results  Labs (all labs ordered are listed, but only abnormal results are displayed) Labs Reviewed - No data to display  EKG   Radiology DG Knee Complete 4 Views Left Result Date: 06/23/2023 CLINICAL DATA:  Left knee pain. EXAM: LEFT KNEE - COMPLETE 4+ VIEW COMPARISON:  None Available. FINDINGS: There is no acute fracture or dislocation. Moderate arthritic changes of the left knee. The bones are osteopenic. There is a small suprapatellar effusion. The soft tissues are unremarkable. IMPRESSION: 1. No acute fracture or dislocation. 2. Moderate arthritic changes. Electronically Signed   By: Angus Bark M.D.   On: 06/23/2023 10:43    Procedures Procedures (including critical care time)  Medications Ordered in UC Medications - No data to display  Initial Impression / Assessment and Plan / UC Course  I have reviewed the triage vital signs and the nursing notes.  Pertinent labs & imaging results that were available during my care of the patient were reviewed by me and considered in my medical decision making (see chart for details).   Vitals and triage reviewed, patient is hemodynamically stable.   Acute pain of left knee - Plan: DG Knee Complete 4 Views Left, DG Knee Complete 4 Views Left, CANCELED: DG Knee Complete 4 Views Right, CANCELED: DG Knee Complete 4 Views Right X-rays of the left knee were reviewed with the patient and show mild effusion and moderate degenerative osteoarthritic changes, primarily in the medial compartment but some also in the patellofemoral compartment. No acute bony abnormality noted. His exam is also consistent with osteoarthritis and possibly some component of degenerative meniscal disease as well.  Recommended continuation of knee compression sleeve, icing, activity as tolerated, NSAIDs (rx for Mobic  15mg ) and tylenol. Follow-up with Sports Medicine if failing to improve for  consideration of cortisone injection. Patient's questions were answered and they are in agreement with this plan   Final Clinical Impressions(s) / UC Diagnoses   Final diagnoses:  Acute pain of left knee   Discharge Instructions      Your x-rays show degenerative osteoarthritis in the left knee. I have sent you a prescription for Mobic  (Meloxicam ) which is similar to Ibuprofen but taken just once daily. If the Meloxicam  is too expensive at the pharmacy I would do the ibuprofen 800mg  (4 tablets) every 8 hours instead. You can also take Tylenol 650mg  every  6 hours as needed for pain. Continue the knee sleeve that you are using.   If this isn't improving you may consider cortisone injections with the sports medicine team. I will ask our scheduler to call you to schedule a visit next week. [facility] Address: [address] [place], [place], [place] [address] Phone: [phone]   ED Prescriptions     Medication Sig Dispense Auth. Provider   meloxicam  (MOBIC ) 15 MG tablet Take 1 tablet (15 mg total) by mouth daily. 30 tablet Tykwon Fera D, MD      PDMP not reviewed this encounter.   Marliss Simple, MD 06/23/23 1115

## 2023-06-23 NOTE — ED Triage Notes (Signed)
 Pt states he has left knee pain and swelling. He has been wearing a hot/cold knee sleeve, taking IBU and using some cream OTC without relief.

## 2023-06-23 NOTE — Discharge Instructions (Addendum)
 Your x-rays show degenerative osteoarthritis in the left knee. I have sent you a prescription for Mobic  (Meloxicam ) which is similar to Ibuprofen but taken just once daily. If the Meloxicam  is too expensive at the pharmacy I would do the ibuprofen 800mg  (4 tablets) every 8 hours instead. You can also take Tylenol 650mg  every 6 hours as needed for pain. Continue the knee sleeve that you are using.   If this isn't improving you may consider cortisone injections with the sports medicine team. I will ask our scheduler to call you to schedule a visit next week. [facility] Address: [address] [place], [place], [place] [address] Phone: [phone]

## 2023-07-02 ENCOUNTER — Ambulatory Visit (INDEPENDENT_AMBULATORY_CARE_PROVIDER_SITE_OTHER): Admitting: Sports Medicine

## 2023-07-02 VITALS — BP 138/88 | Ht 68.0 in | Wt 185.0 lb

## 2023-07-02 DIAGNOSIS — M25562 Pain in left knee: Secondary | ICD-10-CM | POA: Diagnosis not present

## 2023-07-02 DIAGNOSIS — M1712 Unilateral primary osteoarthritis, left knee: Secondary | ICD-10-CM

## 2023-07-02 MED ORDER — METHYLPREDNISOLONE ACETATE 40 MG/ML IJ SUSP
40.0000 mg | Freq: Once | INTRAMUSCULAR | Status: AC
  Administered 2023-07-02: 40 mg via INTRA_ARTICULAR

## 2023-07-02 MED ORDER — PREDNISONE 20 MG PO TABS: 40.0000 mg | ORAL_TABLET | Freq: Every day | ORAL | 0 refills | Status: AC

## 2023-07-02 NOTE — Patient Instructions (Addendum)
 Your pain is due to arthritis. These are the different medications you can take for this: Tylenol 500mg  2 tabs three times a day for pain. I have also sent you a 5 day course of Prednisone to take 2 tablets in the morning to help with pain and swelling. Following the steroid you can continue the Meloxicam  15mg  once daily or Aleve 1-2 tabs twice a day as needed Cortisone injections are an option in the future If cortisone injections do not help, there are different types of shots that may help but they take longer to take effect. It's important that you continue to stay active. Straight leg raises, knee extensions 3 sets of 10 once a day (add ankle weight if these become too easy). Heat or ice 15 minutes at a time 3-4 times a day as needed to help with pain. Water aerobics and cycling with low resistance are the best two types of exercise for arthritis though any exercise is ok as long as it doesn't worsen the pain. Follow up with me in 4 weeks or before your trip to [place].

## 2023-07-02 NOTE — Progress Notes (Signed)
 PCP: Edison Gore, MD  SUBJECTIVE:   HPI:  Patient is a 63 y.o. male here with chief complaint of left knee pain.   I saw him for this 2 weeks ago at urgent care. His x-rays were reflective of osteoarthritis and his exam was consistent with that. I prescribed him Mobic  and recommended continued use of his knee sleeve. He notes no interval change in his pain or swelling since that visit. Denies catching/locking/instability sensation. No new complaints. No history of T2DM.  Pertinent ROS were reviewed with the patient and found to be negative unless otherwise specified above in HPI.   PERTINENT  PMH / PSH / FH / SH:  Past Medical, Surgical, Social, and Family History Reviewed & Updated in the EMR.  Pertinent findings include:  HTN, HLD  No Known Allergies  OBJECTIVE:  BP 138/88   Ht 5\' 8"  (1.727 m)   Wt 185 lb (83.9 kg)   BMI 28.13 kg/m   PHYSICAL EXAM:  GEN: Alert and Oriented, NAD, comfortable in exam room RESP: Unlabored respirations, symmetric chest rise PSY: normal mood, congruent affect   LEFT KNEE MSK EXAM: Moderate effusion present both on physical exam and limited U/S evaluation. No gross deformity, warmth or overlaying skin changes. TTP along medial joint line, no longer tender over lateral joint. ROM is 5-100d with normal strength. Negative ant/post drawers. Negative valgus/varus testing. Negative lachman.  Positive mcmurray and apley medially.  NV intact distally.  4-view X-rays of Left Knee from 06/24/2023 were reviewed with the patient and his wife today which show moderate tricompartmental osteoarthritis worse in the medial compartment. No acute abnormality noted. Assessment & Plan Primary osteoarthritis of left knee History, exam and x-rays reflective of tricompartmental osteoarthritis. His exam does also raise concern for degenerative medial meniscus tear. He will be traveling back to [place] at the end of June to care for his mother without known return  date planned which is impacting his decision for management approach.  Plan: - Discussed recommendation for knee aspiration and cortisone injection. Patient wary on decision for corticosteroid injection and after discussion with him, his daughter via the phone, and a pharmacist friend of his he elected on just an aspiration at this time. He is concerned about recurrence while he is in [place] and not being able to have this repeated there. - Rx for Prednisone 40mg  x 5 days provided today. If this benefits him consider a standby script for Prednisone while traveling to [place]. - Other conservative mgmt strategies reviewed (NSAIDs, tylenol, bracing/compression sleeve, quad strengthening, regular exercise) - Follow-up in 1 month to re-evaluate, sooner if issues arise. May try cortisone injection vs MRI of the knee if doesn't get great response from Prednisone.  Left Knee Ultrasound Guided Aspiration: After informed written consent timeout was performed, patient was in seated position on exam table.  Left knee was cleaned with Chloraprep. A linear array probe was utilized to visualized the suprapatellar recess. Moderate effusion noted on ultrasound in this space. Using sterile no-touch technique and an in-plane superolateral approach an injection of 3mL of 1% lidocaine was injected for local anesthesia track using a 25g 1.5in needle. Utilizing the same superolateral in-plane approach an 18g 1.5in needle was advanced into the right suprapatellar recess and 26cc of straw colored fluid was aspirated. Following the injection a bandage was applied to the area. Patient tolerated procedure well without immediate complications. The patient was counseled as to the expected post-aspiration course. Instructed as to concerning symptoms and advised  to contact the office if these should arise. Images saved locally to the GE U/S machine, not uploaded to PACS.  Lin Rend, MD PGY-4, Sports Medicine Fellow [facility]  Sports Medicine Center

## 2023-07-28 ENCOUNTER — Ambulatory Visit (INDEPENDENT_AMBULATORY_CARE_PROVIDER_SITE_OTHER): Payer: Self-pay | Admitting: Sports Medicine

## 2023-07-28 VITALS — BP 148/81 | Ht 68.0 in | Wt 190.0 lb

## 2023-07-28 DIAGNOSIS — M1712 Unilateral primary osteoarthritis, left knee: Secondary | ICD-10-CM

## 2023-07-28 MED ORDER — METHYLPREDNISOLONE ACETATE 40 MG/ML IJ SUSP
40.0000 mg | Freq: Once | INTRAMUSCULAR | Status: AC
  Administered 2023-07-28: 40 mg via INTRA_ARTICULAR

## 2023-07-28 NOTE — Progress Notes (Signed)
 PCP: Edison Gore, MD  Subjective:   HPI: Patient is a 63 y.o. male here for left knee osteoarthritis follow-up.  Larry Scott was last seen on 07/02/2023 for knee arthritis and was started on a prednisone  taper and given exercises to strengthen his quads and hamstrings. Since then he states he has not noticed any improvement in his symptoms. He completed the prednisone  taper and noted limited benefit, and has been taking 800mg  ibuprofen twice daily to control his symptoms. He has not been doing any exercises for the quadriceps or hamstrings. He has been wearing his brace but notes it worsens his leg edema to wear it. He does not have any weakness, numbness, tingling of the leg nor radicular symptoms.   Past Medical History:  Diagnosis Date   Hyperlipidemia    Hypertension     Current Outpatient Medications on File Prior to Visit  Medication Sig Dispense Refill   amLODipine  (NORVASC ) 10 MG tablet Take 1 tablet (10 mg total) by mouth daily. 30 tablet 0   aspirin 81 MG chewable tablet Chew 81 mg by mouth daily.     benzonatate  (TESSALON ) 100 MG capsule Take 1 capsule by mouth every 8 (eight) hours for cough. 21 capsule 0   enalapril  (VASOTEC ) 10 MG tablet Take 1 tablet (10 mg total) by mouth daily. 30 tablet 0   hydrochlorothiazide  (HYDRODIURIL ) 12.5 MG tablet Take 1 tablet (12.5 mg total) by mouth daily. 30 tablet 0   meloxicam  (MOBIC ) 15 MG tablet Take 1 tablet (15 mg total) by mouth daily. 30 tablet 0   ofloxacin (OCUFLOX) 0.3 % ophthalmic solution Place into the left eye.     prednisoLONE acetate (PRED FORTE) 1 % ophthalmic suspension 1 drop 2 (two) times daily.     rosuvastatin  (CRESTOR ) 20 MG tablet Take 1 tablet (20 mg total) by mouth daily. 90 tablet 3   No current facility-administered medications on file prior to visit.    Past Surgical History:  Procedure Laterality Date   CATARACT EXTRACTION Bilateral     No Known Allergies  There were no vitals taken for this visit.      No  data to display              No data to display              Objective:  Physical Exam:  Gen: NAD, comfortable in exam room  Left Knee Inspection: No bruising, mild swelling along the medial and lateral joint lines, no appreciable joint effusion Palpation: Tender to medial and lateral joint line. No tenderness along patella, popliteal fossa.  Strength: Flexion 5/5, Extension 5/5, Plantarflexion 5/5 Special Tests: Varus/Valgus negative, Lachman negative, Anterior Drawer negative, Posterior Drawer negative, Apleys negative  Assessment & Plan:  Patient is a 63 y.o. male here for left knee osteoarthritis. He has noted little benefit from the prednisone  taper nor NSAIDs that we prescribed to him previously, and has had only modest benefit from bracing. After discussing next steps he elected to proceed with a steroid injection to the knee as he is leaving for a trip to [place] in two weeks.   1. Left Knee Osteoarthritis - Left knee intra-articular cortisone injection today as below - Can take ibuprofen, tylenol as needed for pain - Gradually reintroduce exercises, consider adding core strengthening for back pain - Follow up as needed  PROCEDURE:  Risks & benefits of left knee cortisone injection reviewed. Consent obtained. Time-out completed. Patient prepped and draped in the normal fashion. Area  cleansed with alcohol. Ethyl chloride spray used to anesthetize the skin. Solution of 4 mL 1% lidocaine with 1 mL methylprednisolone  (Depo-medrol ) 40mg /mL injected into the left knee using a 22-gauge 1.5-inch needle via the anterior lateral approach. Patient tolerated procedure well without any complications. Area covered with adhesive bandage.  Post-procedure care reviewed, all questions answered.   Elnoria Hails, MS4 [facility] [employer]   Patient seen and evaluated by Elnoria Hails, MS4.  I have seen and examined the patient independently and agree with their assessment as above. I was also  directly supervising the above injection along with Dr. Marvel Slicker. Lin Rend, MD PGY-4, Sports Medicine Fellow [facility]

## 2023-08-04 ENCOUNTER — Ambulatory Visit (INDEPENDENT_AMBULATORY_CARE_PROVIDER_SITE_OTHER): Payer: Self-pay | Admitting: Sports Medicine

## 2023-08-04 VITALS — BP 148/85 | Ht 68.0 in | Wt 190.0 lb

## 2023-08-04 DIAGNOSIS — M1712 Unilateral primary osteoarthritis, left knee: Secondary | ICD-10-CM

## 2023-08-04 NOTE — Progress Notes (Signed)
   PCP: Romelle Booty, MD  SUBJECTIVE:   HPI:  Larry Scott is a 63 y.o. male here for follow-up of left knee pain.   He had a left knee intra-articular cortisone injection performed last week on 07/28/2023.  Since he notes that the knee is still mildly swollen but his pain has greatly improved.  He still has some mild pain with deep knee flexion.  He is also been doing the exercises we discussed last week and notes that his back pain has been improving as well.  He is traveling back to [place] later this week and would like a summary of his last few visits to provide to his care team back home if needed.  He has not been taking any NSAIDs over the past few weeks.  Pertinent ROS were reviewed with the Larry Scott and found to be negative unless otherwise specified above in HPI.   PERTINENT  PMH / PSH / FH / SH:  Past Medical, Surgical, Social, and Family History Reviewed & Updated in the EMR.  Pertinent findings include:  Hypertension, hyperlipidemia  No Known Allergies  OBJECTIVE:  BP (!) 148/85   Ht 5' 8 (1.727 m)   Wt 190 lb (86.2 kg)   BMI 28.89 kg/m   PHYSICAL EXAM:  GEN: Alert and Oriented, NAD, comfortable in exam room RESP: Unlabored respirations, symmetric chest rise PSY: normal mood, congruent affect   Left knee MSK EXAM: Mild effusion present.  No significant deformity noted or overlying skin changes. Mildly tender to palpation along the posterior medial aspect of the joint line though this is greatly improved from last week. Range of motion is 0 to 120 degrees with normal strength Negative ligament testing Positive McMurray Neurovascular intact distally   Assessment & Plan Primary osteoarthritis of left knee Larry Scott is doing better following recent corticosteroid injection.  We discussed that he can still continue to have some improvement over the next week from that injection.  He is pleased with his progress.  We did again discuss quadricep strengthening exercises to try  and decrease symptom burden moving forward and promote strength around the knee, he was provided a series of home exercises to be performed moving forward.  Did also discuss continuation of as needed ibuprofen.  He will follow-up with us  as needed.   Prentice Niece, MD PGY-4, Sports Medicine Fellow [facility]

## 2023-08-04 NOTE — Patient Instructions (Addendum)
 Summary of Left Knee Pain Larry Scott has tricompartmental osteoarthritis of the left knee, worse in the medial compartment. 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 He had 26cc of fluid aspirated off the left knee on 07/02/23, and then on [address] he had a cortisone injection of the left knee which was well tolerated and improved his pain. He is traveling back to [place] at the end of June with instructions for as needed ibuprofen use, knee compression sleeve use, and quadriceps strengthening exercises.   Larry Scott - do these knee exercises daily for the next 2-3 months, then can maintain 2-3 times per week after that.
